# Patient Record
Sex: Male | Born: 1955 | Race: White | Hispanic: No | Marital: Married | Smoking: Former smoker
Health system: Southern US, Community
[De-identification: ages and names within clinical notes are randomized; demographics above are authoritative.]

## PROBLEM LIST (undated history)

## (undated) DIAGNOSIS — J309 Allergic rhinitis, unspecified: Secondary | ICD-10-CM

## (undated) DIAGNOSIS — E785 Hyperlipidemia, unspecified: Secondary | ICD-10-CM

## (undated) DIAGNOSIS — K589 Irritable bowel syndrome without diarrhea: Secondary | ICD-10-CM

## (undated) HISTORY — PX: NASAL SINUS SURGERY: SHX719

---

## 2008-04-30 ENCOUNTER — Encounter: Admission: RE | Admit: 2008-04-30 | Discharge: 2008-04-30 | Payer: Self-pay | Admitting: Unknown Physician Specialty

## 2011-05-28 ENCOUNTER — Other Ambulatory Visit: Payer: Self-pay | Admitting: Unknown Physician Specialty

## 2011-05-28 ENCOUNTER — Ambulatory Visit
Admission: RE | Admit: 2011-05-28 | Discharge: 2011-05-28 | Disposition: A | Discharge status: Home / Self Care | Payer: BC Managed Care – PPO | Source: Ambulatory Visit | Attending: Unknown Physician Specialty | Admitting: Unknown Physician Specialty

## 2011-05-28 DIAGNOSIS — R059 Cough, unspecified: Secondary | ICD-10-CM

## 2011-05-28 DIAGNOSIS — R05 Cough: Secondary | ICD-10-CM

## 2019-01-11 ENCOUNTER — Encounter: Payer: Self-pay | Admitting: Emergency Medicine

## 2019-01-11 ENCOUNTER — Emergency Department (INDEPENDENT_AMBULATORY_CARE_PROVIDER_SITE_OTHER): Payer: BC Managed Care – PPO

## 2019-01-11 ENCOUNTER — Other Ambulatory Visit: Payer: Self-pay

## 2019-01-11 ENCOUNTER — Emergency Department (INDEPENDENT_AMBULATORY_CARE_PROVIDER_SITE_OTHER)
Admission: EM | Admit: 2019-01-11 | Discharge: 2019-01-11 | Disposition: A | Discharge status: Home / Self Care | Payer: BC Managed Care – PPO | Source: Home / Self Care | Attending: Family Medicine | Admitting: Family Medicine

## 2019-01-11 DIAGNOSIS — H8111 Benign paroxysmal vertigo, right ear: Secondary | ICD-10-CM

## 2019-01-11 DIAGNOSIS — R42 Dizziness and giddiness: Secondary | ICD-10-CM

## 2019-01-11 DIAGNOSIS — R519 Headache, unspecified: Secondary | ICD-10-CM | POA: Diagnosis not present

## 2019-01-11 HISTORY — DX: Hyperlipidemia, unspecified: E78.5

## 2019-01-11 HISTORY — DX: Allergic rhinitis, unspecified: J30.9

## 2019-01-11 HISTORY — DX: Irritable bowel syndrome, unspecified: K58.9

## 2019-01-11 MED ORDER — MECLIZINE HCL 25 MG PO TABS: ORAL_TABLET | ORAL | 1 refills | Status: AC

## 2019-01-11 MED ORDER — PREDNISONE 20 MG PO TABS: ORAL_TABLET | ORAL | 0 refills | Status: AC

## 2019-01-11 NOTE — ED Triage Notes (Signed)
Dizziness x 2 months, had video visit on the 18th prescribed clarithromycim 500 and Meclizine 12.5, says he is not better

## 2019-01-11 NOTE — ED Provider Notes (Signed)
[facility]    CSN: [id number] Arrival date & time: 01/11/19  [id number]      History   Chief Complaint Chief Complaint  Patient presents with  . Dizziness    HPI Shaun Hernandez is a 63 y.o. male.   Patient began having intermittent nocturnal dizziness about 3 months ago.  His symptoms became worse last month, and this month his vertigo symptoms have become constant.  He sometimes has mild nausea, but no vomiting.  He has noted intermittent tinnitus in his right ear recently, but no hearing loss.  He had a virtual visit one week and treated with clarithromycin and meclizine 12.5mg  without improvement.  He denies other neurologic symptoms. He has a past history of allergic rhinitis, having undergone frontal sinus surgery about 7 years ago.  He reports that he is a [employer], and recently finding himself with his head drifting to the right, worse when he makes right-hand turns.  The history is provided by the patient.    Past Medical History:  Diagnosis Date  . Allergic rhinitis   . Hyperlipidemia   . IBS (irritable bowel syndrome)     Active problem:  Erectile dysfunction   Past Surgical History:  Procedure Laterality Date  . NASAL SINUS SURGERY         Home Medications    Prior to Admission medications   Medication Sig Start Date End Date Taking? Authorizing Provider  clarithromycin (BIAXIN) 500 MG tablet Take 500 mg by mouth 2 (two) times daily.   Yes [provider]  meclizine (ANTIVERT) 25 MG tablet Take one tab PO BID PRN dizziness 01/11/19   Lattie HawBeese, Ko Bardon A, MD  predniSONE (DELTASONE) 20 MG tablet Take one tab by mouth twice daily for 4 days, then one daily for 3 days. Take with food. 01/11/19   Lattie HawBeese, Keylen Eckenrode A, MD    Family History Family History  Problem Relation Age of Onset  . Cancer Mother   . Dementia Father     Social History Social History   Tobacco Use  . Smoking status: Former Smoker    Types: Cigarettes    Quit date:  1986    Years since quitting: 34.9  . Smokeless tobacco: Never Used  Substance Use Topics  . Alcohol use: Yes  . Drug use: Not on file     Allergies   Patient has no known allergies.   Review of Systems Review of Systems No sore throat No cough No pleuritic pain No wheezing + nasal congestion No post-nasal drainage + sinus pain/pressure over frontal sinus No itchy/red eyes No earache + tinnitus right ear + dizziness No hemoptysis No SOB No fever/chills + nausea No vomiting No abdominal pain No diarrhea No urinary symptoms No skin rash No fatigue No myalgias + frontal headache   Physical Exam Triage Vital Signs ED Triage Vitals  Enc Vitals Group     BP 01/11/19 0923 (!) 144/92     Pulse Rate 01/11/19 0923 (!) 55     Resp --      Temp 01/11/19 0923 98.4 F (36.9 C)     Temp Source 01/11/19 0923 Oral     SpO2 01/11/19 0923 99 %     Weight 01/11/19 0924 225 lb (102.1 kg)     Height 01/11/19 0924 6' (1.829 m)     Head Circumference --      Peak Flow --      Pain Score 01/11/19 0924 0  Pain Loc --      Pain Edu? --      Excl. in GC? --    Orthostatic VS for the past 24 hrs:  BP- Lying Pulse- Lying BP- Sitting Pulse- Sitting BP- Standing at 0 minutes Pulse- Standing at 0 minutes  01/11/19 0931 146/79 61 (!) 135/93 62 137/87 60    Updated Vital Signs BP (!) 144/92 (BP Location: Right Arm)   Pulse (!) 55   Temp 98.4 F (36.9 C) (Oral)   Ht 6' (1.829 m)   Wt 102.1 kg   SpO2 99%   BMI 30.52 kg/m   Visual Acuity Right Eye Distance:   Left Eye Distance:   Bilateral Distance:    Right Eye Near:   Left Eye Near:    Bilateral Near:     Physical Exam Nursing notes and Vital Signs reviewed. Appearance:  Patient appears stated age, and in no acute distress Eyes:  Pupils are equal, round, and reactive to light and accomodation.  Extraocular movement is intact.  Conjunctivae are not inflamed.  Fundi benign. No nystagmus. Ears:  Canals normal.   Tympanic membranes normal.  Nose:  Mildly congested turbinates.   Frontal sinus tenderness is present.  Pharynx:  Normal Neck:  Supple.  No adenopathy Lungs:  Clear to auscultation.  Breath sounds are equal.  Moving air well. Heart:  Regular rate and rhythm without murmurs, rubs, or gallops.  Abdomen:  Nontender  Extremities:  No edema.  Skin:  No rash present.  Neurologic:  Cranial nerves 2 through 12 are normal.  Patellar, achilles, and elbow reflexes are normal.  Cerebellar function is intact (finger-to-nose and rapid alternating hand movement).  Gait and station are normal.    UC Treatments / Results  Labs (all labs ordered are listed, but only abnormal results are displayed) Labs Reviewed -  Tympanometry:  Right ear tympanogram is wide Left ear tympanogram normal  EKG   Radiology Dg Sinuses Complete  Result Date: 01/11/2019 CLINICAL DATA:  63 year old male with frontal sinus pain and dizziness for 2 weeks. EXAM: PARANASAL SINUSES - COMPLETE 3 + VIEW COMPARISON:  None. FINDINGS: Bone mineralization is within normal limits. The paranasal sinus are aerated. There is no evidence of sinus opacification air-fluid levels or mucosal thickening. Mastoids also appear symmetrically pneumatized. No acute osseous abnormality identified. Surgical clips in the right neck, perhaps endarterectomy related. IMPRESSION: Negative radiographic sinus series. Electronically Signed   By: Odessa Fleming M.D.   On: 01/11/2019 10:53    Procedures Procedures (including critical care time)  Medications Ordered in UC Medications - No data to display  Initial Impression / Assessment and Plan / UC Course  I have reviewed the triage vital signs and the nursing notes.  Pertinent labs & imaging results that were available during my care of the patient were reviewed by me and considered in my medical decision making (see chart for details).    Note normal sinus films.  Relatively normal exam reassuring.   ?Meniere's disease. Begin trial of prednisone burst/taper.   Recommend follow-up with ENT in two weeks.   Final Clinical Impressions(s) / UC Diagnoses   Final diagnoses:  Benign paroxysmal positional vertigo of right ear     Discharge Instructions     Avoid rapid movement. Try taking meclizine 12.5mg , two tabs for vertigo.  If helpful may begin Rx for 25mg  tabs.    ED Prescriptions    Medication Sig Dispense Auth. Provider   predniSONE (DELTASONE) 20 MG tablet  Take one tab by mouth twice daily for 4 days, then one daily for 3 days. Take with food. 11 tablet Kandra Nicolas, MD   meclizine (ANTIVERT) 25 MG tablet Take one tab PO BID PRN dizziness 15 tablet Kandra Nicolas, MD        Kandra Nicolas, MD 01/11/19 [phone]

## 2019-01-11 NOTE — Discharge Instructions (Addendum)
Avoid rapid movement. Try taking meclizine 12.5mg , two tabs for vertigo.  If helpful may begin Rx for 25mg  tabs.

## 2019-04-20 ENCOUNTER — Ambulatory Visit: Payer: BC Managed Care – PPO | Attending: Internal Medicine

## 2019-04-20 DIAGNOSIS — Z23 Encounter for immunization: Secondary | ICD-10-CM | POA: Insufficient documentation

## 2019-04-20 NOTE — Progress Notes (Signed)
   Covid-19 Vaccination Clinic  Name:  Shaun Hernandez    MRN: [id number] DOB: February 28, 1955  04/20/2019  Mr. [name] was observed post Covid-19 immunization for 15 minutes without incident. He was provided with Vaccine Information Sheet and instruction to access the V-Safe system.   Mr. [name] was instructed to call 911 with any severe reactions post vaccine: [name] Kitchen Difficulty breathing  . Swelling of face and throat  . A fast heartbeat  . A bad rash all over body  . Dizziness and weakness   Immunizations Administered    Name Date Dose VIS Date Route   Pfizer COVID-19 Vaccine 04/20/2019  9:01 AM 0.3 mL 01/27/2019 Intramuscular   Manufacturer: [employer], [employer]   Lot: [id number]   NDC: [address]-9

## 2019-05-17 ENCOUNTER — Ambulatory Visit: Payer: BC Managed Care – PPO | Attending: Internal Medicine

## 2019-05-17 DIAGNOSIS — Z23 Encounter for immunization: Secondary | ICD-10-CM

## 2019-05-17 NOTE — Progress Notes (Signed)
   Covid-19 Vaccination Clinic  Name:  Shaun Hernandez    MRN: [id number] DOB: [birth year]  05/17/2019  Mr. [name] was observed post Covid-19 immunization for 15 minutes without incident. He was provided with Vaccine Information Sheet and instruction to access the V-Safe system.   Mr. [name] was instructed to call 911 with any severe reactions post vaccine: [name] Kitchen Difficulty breathing  . Swelling of face and throat  . A fast heartbeat  . A bad rash all over body  . Dizziness and weakness   Immunizations Administered    Name Date Dose VIS Date Route   Pfizer COVID-19 Vaccine 05/17/2019  8:28 AM 0.3 mL 01/27/2019 Intramuscular   Manufacturer: [employer], [employer]   Lot: [id number]   NDC: [address]-2

## 2021-03-17 IMAGING — DX DG SINUSES COMPLETE 3+V
3 series · 3 of 3 positions shown · non-contrast
Comparison: None.

CLINICAL DATA: 62-year-old male with frontal sinus pain and
dizziness for 2 weeks.

EXAM:
PARANASAL SINUSES - COMPLETE 3 + VIEW

[pns waters]
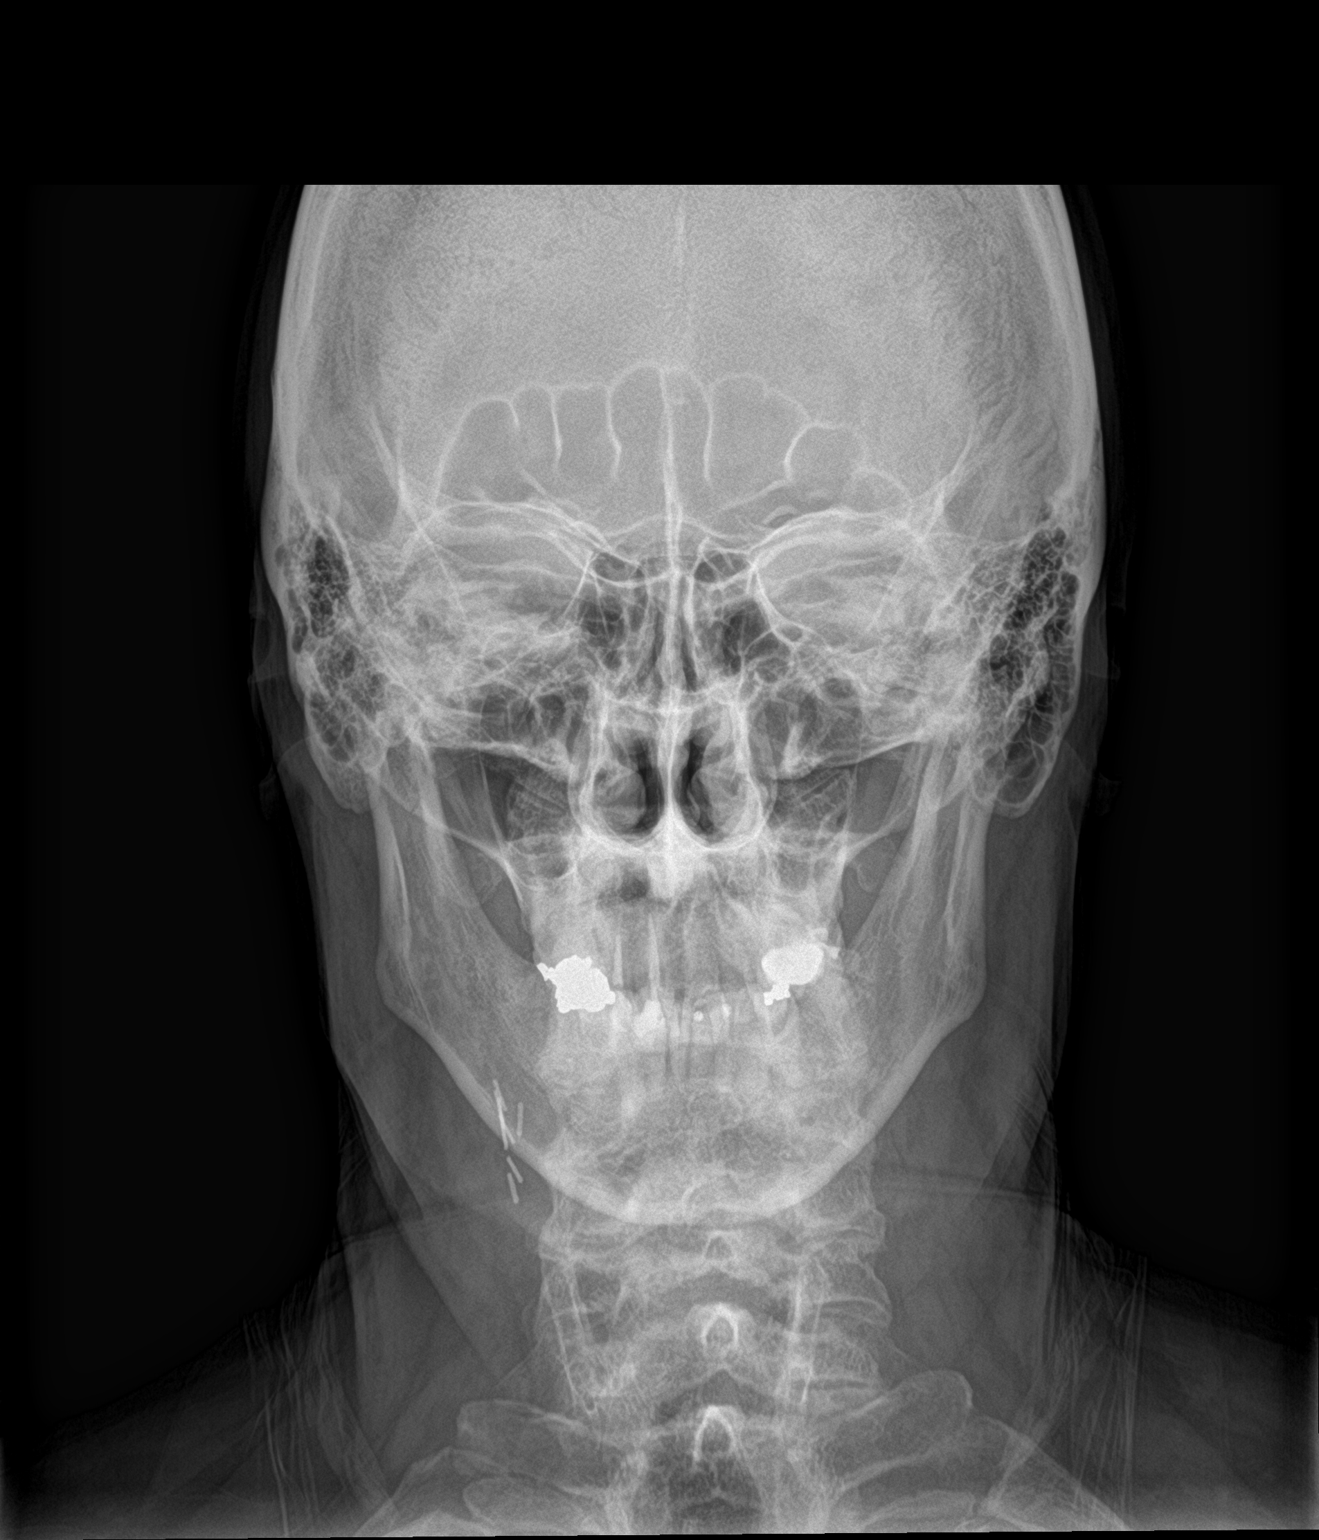

[[person_name]]
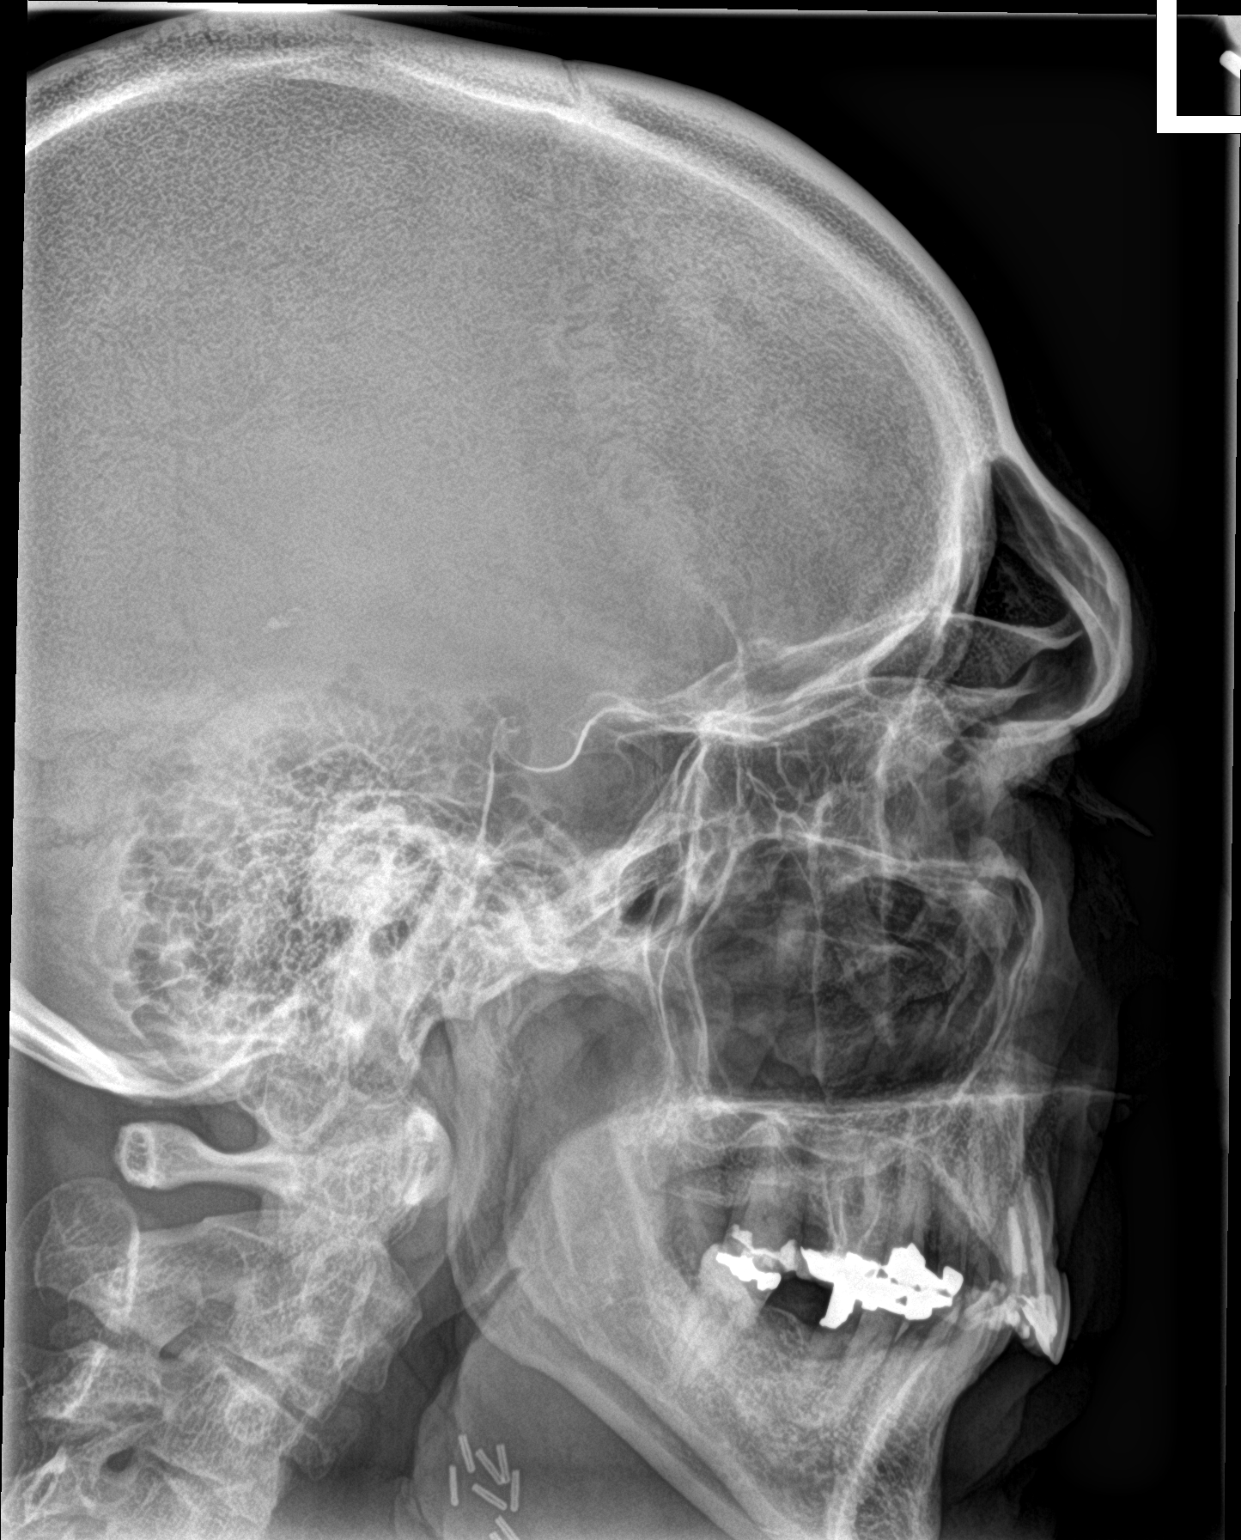

[pns lat]
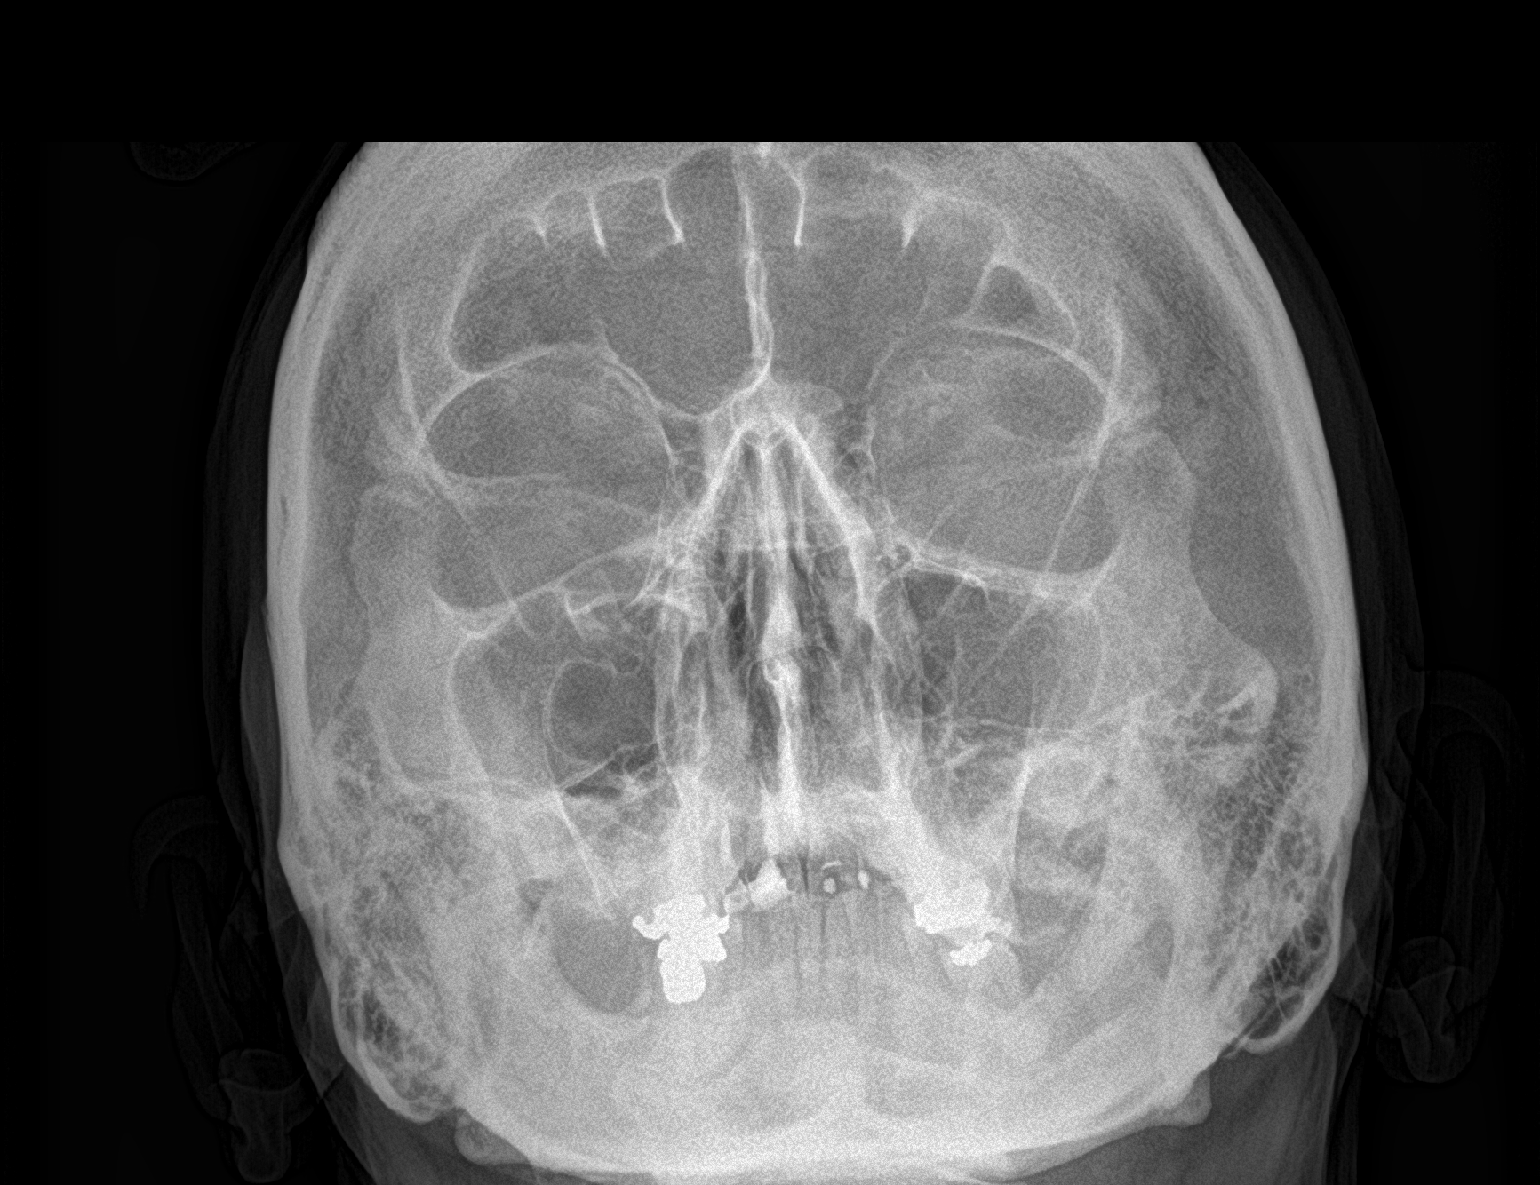

[3 of 3 positions shown; findings below may reference images not displayed]

FINDINGS: Bone mineralization is within normal limits. The paranasal sinus are
aerated. There is no evidence of sinus opacification air-fluid
levels or mucosal thickening. Mastoids also appear symmetrically
pneumatized. No acute osseous abnormality identified. Surgical clips
in the right neck, perhaps endarterectomy related.
IMPRESSION: Negative radiographic sinus series.
